# Patient Record
Sex: Female | Born: 1960
Health system: Southern US, Community
[De-identification: ages and names within clinical notes are randomized; demographics above are authoritative.]

---

## 2000-01-28 ENCOUNTER — Encounter: Payer: Self-pay | Admitting: Emergency Medicine

## 2000-01-28 ENCOUNTER — Emergency Department (HOSPITAL_COMMUNITY): Admission: EM | Admit: 2000-01-28 | Discharge: 2000-01-28 | Payer: Self-pay | Admitting: Emergency Medicine

## 2000-02-16 ENCOUNTER — Encounter: Payer: Self-pay | Admitting: Emergency Medicine

## 2000-02-16 ENCOUNTER — Emergency Department (HOSPITAL_COMMUNITY): Admission: EM | Admit: 2000-02-16 | Discharge: 2000-02-16 | Payer: Self-pay | Admitting: Emergency Medicine

## 2002-10-13 ENCOUNTER — Encounter: Payer: Self-pay | Admitting: Family Medicine

## 2002-10-13 ENCOUNTER — Ambulatory Visit (HOSPITAL_COMMUNITY): Admission: RE | Admit: 2002-10-13 | Discharge: 2002-10-13 | Payer: Self-pay | Admitting: Family Medicine

## 2003-10-19 ENCOUNTER — Other Ambulatory Visit: Admission: RE | Admit: 2003-10-19 | Discharge: 2003-10-19 | Payer: Self-pay | Admitting: *Deleted

## 2003-12-03 ENCOUNTER — Emergency Department (HOSPITAL_COMMUNITY): Admission: EM | Admit: 2003-12-03 | Discharge: 2003-12-03 | Payer: Self-pay

## 2004-03-08 ENCOUNTER — Inpatient Hospital Stay (HOSPITAL_COMMUNITY): Admission: AD | Admit: 2004-03-08 | Discharge: 2004-03-08 | Payer: Self-pay | Admitting: *Deleted

## 2004-05-02 ENCOUNTER — Other Ambulatory Visit: Admission: RE | Admit: 2004-05-02 | Discharge: 2004-05-02 | Payer: Self-pay | Admitting: Gynecology

## 2004-05-10 ENCOUNTER — Encounter: Admission: RE | Admit: 2004-05-10 | Discharge: 2004-05-10 | Payer: Self-pay | Admitting: Gynecology

## 2005-03-01 ENCOUNTER — Encounter (INDEPENDENT_AMBULATORY_CARE_PROVIDER_SITE_OTHER): Payer: Self-pay | Admitting: Specialist

## 2005-03-01 ENCOUNTER — Inpatient Hospital Stay (HOSPITAL_COMMUNITY): Admission: RE | Admit: 2005-03-01 | Discharge: 2005-03-03 | Payer: Self-pay | Admitting: Gynecology

## 2005-04-26 ENCOUNTER — Other Ambulatory Visit: Admission: RE | Admit: 2005-04-26 | Discharge: 2005-04-26 | Payer: Self-pay | Admitting: Gynecology

## 2006-04-30 ENCOUNTER — Other Ambulatory Visit: Admission: RE | Admit: 2006-04-30 | Discharge: 2006-04-30 | Payer: Self-pay | Admitting: Gynecology

## 2006-10-13 ENCOUNTER — Emergency Department (HOSPITAL_COMMUNITY): Admission: EM | Admit: 2006-10-13 | Discharge: 2006-10-13 | Payer: Self-pay | Admitting: Emergency Medicine

## 2010-05-08 ENCOUNTER — Emergency Department (HOSPITAL_COMMUNITY): Admission: EM | Admit: 2010-05-08 | Discharge: 2010-05-08 | Payer: Self-pay | Admitting: Emergency Medicine

## 2011-03-03 NOTE — H&P (Signed)
Michelle Choi, TRAORE                  ACCOUNT NO.:  [id number]   MEDICAL RECORD NO.:  [id number]          PATIENT TYPE:  OBV   LOCATION:  1617                         FACILITY:  [facility]   PHYSICIAN:  Gretta Cool, M.D. DATE OF BIRTH:  1961-01-29   DATE OF ADMISSION:  03/01/2005  DATE OF DISCHARGE:                                HISTORY & PHYSICAL   CHIEF COMPLAINT:  1.  Incapacitating cyclic pelvic pain.  2.  Abnormal uterine bleeding.   HISTORY OF PRESENT ILLNESS:  A 50 year old G3, P1, with only one living  child.  She has had regular cyclic menses until recently, when her flow has  increased to seven days, associated with difficulty controlling with pads.  She also has had increasingly severe an incapacitating cyclic pelvic pain.  Her pain onsets approximately midcycle, lasts until menses, is relieved  after onset of menses and recurs in subsequent cycles.  She has had  particularly severe right lower quadrant.  She has had evaluation including  cultures and treatment for possible endometritis or salpingitis without  improvement.  Cultures were negative.  Her FSH is elevated slightly, day 3,  to 16.2, indicating that she is approaching menopause.  She has a history of  cesarean section delivery and a history of tubal sterilization at a  subsequent procedure.  She has on ultrasound a small posterior wall myoma  that encroaches the cavity.  She also has a complex scalloped ovarian cyst  suspicious for a small ovarian neoplasm versus hemorrhage into corpus luteum  cyst.  Her Pap is current and normal.  She has no history of abnormal  cytology ever.  She has no pelvic support problems with history of cesarean  section delivery only.   PAST MEDICAL HISTORY:  Usual childhood disease without sequelae.  Medical  illnesses:  None of consequence.  Accidents and injuries:  None.   ALLERGIES:  None.   PRESENT MEDICATIONS:  None.   FAMILY HISTORY:  Mother died of lung cancer, was a heavy  smoker.  Father  died of emphysema.  She has a history of a brother with MI at 51 and sister  with hypertension.  No other known familial tendencies.   SOCIAL HISTORY:  The patient is married.  She is employed at [employer].  She has one 3 year old child living at home.   PHYSICAL EXAMINATION:  GENERAL:  Well-developed, well-nourished black  female, 166 pounds, 5 feet 6-1/2 inches.  GENERAL:  Entirely unremarkable.  HEENT:  Oropharynx clear.  NECK:  Supple without mass or thyroid enlargement.  CHEST:  Clear P to A.  BREASTS:  Without mass, nodes, nipple discharge.  CARDIAC:  Regular rate and rhythm without murmur or cardiac enlargement.  ABDOMEN:  Soft, scaphoid, without mass or organomegaly.  A large depression  from her previous cesarean section scar is present.  PELVIC:  External genitalia normal female.  Vagina clean, rugous.  Cervix is  large, parous.  Her uterus is exquisitely tender to manipulation.  I do not  palpate significant adnexal mass or fullness.  Her uterus is upper limits of  normal size, adnexa clear.  Rectovaginal confirms.  EXTREMITIES:  Negative.  NEUROLOGIC:  Physiologic.   IMPRESSION:  1.  Incapacitating cyclic pelvic pain, increasingly severe.  2.  Abnormal uterine bleeding.  3.  History of cesarean section delivery.  4.  History of tubal sterilization.   PLAN:  Laparotomy, supracervical versus total abdominal hysterectomy,  possible salpingo-oophorectomy.      CWL/MEDQ  D:  03/02/2005  T:  03/02/2005  Job:  [id number]

## 2011-03-03 NOTE — Op Note (Signed)
NAMETELENA, Michelle Choi                  ACCOUNT NO.:  [id number]   MEDICAL RECORD NO.:  [id number]          PATIENT TYPE:  AMB   LOCATION:  DAY                          FACILITY:  [facility]   PHYSICIAN:  Gretta Cool, M.D. DATE OF BIRTH:  04-Nov-1960   DATE OF PROCEDURE:  03/01/2005  DATE OF DISCHARGE:                                 OPERATIVE REPORT   PREOPERATIVE DIAGNOSES:  1.  Incapacitating cyclic pelvic pain.  2.  Abnormal uterine bleeding.   POSTOPERATIVE DIAGNOSES:  1.  Incapacitating cyclic pelvic pain.  2.  Abnormal uterine bleeding.  3.  Previous salpingectomy.   SURGEON:  Gretta Cool, M.D.   ASSISTANT:  Raynald Kemp, M.D.   PROCEDURE:  Supracervical hysterectomy and bilateral oophorectomy. Note she  has had previous salpingectomy elsewhere.   DESCRIPTION OF PROCEDURE:  Under excellent general anesthesia oral tracheal,  a Pfannenstiel incision was made by excision of her previous scar. Note she  had a large indentation the entire length of the incision. At this point,  the incision was extended through the fascia. The fascia was then dissected  from the rectus muscles, the rectus muscles separated in the midline,  peritoneum opened. The abdomen was then explored. There were no  abnormalities in the upper abdomen. Both kidneys felt normal, no adenopathy.  Examination of the uterus and ovaries revealed previous salpingectomy of  virtually the entire fallopian tube. Both ovaries appeared quiescent. Note  she has documentation of FSH elevation. There was significant advancement of  the bladder from previous cesarean section delivery. At this point, the  procedure was begun by transection of the round ligaments, the ligaments  were tied and the anterior leaf of the broad ligament opened. The  infundibulopelvic vessels were then skeletonized. A decision was made to  remove both ovaries because of their adhesion from the previous  salpingectomy and evidence of  perimenopausal state. There was some suspicion  of endometriosis in the depth of the pelvis but none involving the ovaries.  The infundibulopelvic vessels were then clamped, cut, sutured and tied with  #0 Vicryl. A second free tie was used to doubly ligate the pedicle. The  uterine vessels were then skeletonized, clamped, cut, sutured and tied with  #0 Vicryl. The cervix was then incised and a conical incision made so as to  remove most of the endocervical tissue by conical incision to remove the  canal. The remaining endocervical tissue was then cauterized by monopolar  cautery so as to eliminate any viable endometrial glands present. At this  point, the cervix was closed with a mattress closure of #0 Vicryl, bleeding  was well controlled. At this point, the cuff was closed with running suture  of 2-0 Monocryl. The packs and retractors were then removed, pelvis  irrigated to remove all debris. The abdominoperitoneum was then closed with  running suture of #0 Monocryl. The rectus muscles were plicated in the  midline with a running suture of #0 Monocryl also. The fascia was  approximated with a running suture of #0 Vicryl from each angle to the  midline. The subcutaneous tissue was then undermined along the rectus fascia  so as to remove the adhesion of Scarpa's fascia to the primary fascial  incision and to remove the deep depression in the previous scar. Scarpa's  fascia was then excised where it was densely scarred and the subcutaneous  tissue undermined for a distance of at least three times the distance needed  to approximate the Scarpa's once again.  Scarpa's was then approximated with interrupted sutures of 3-0 Vicryl. The  skin was then closed with staples and intermittent sutures of 4-0 black silk  so as to prevent inversion of the incision line. At this point, the  procedure was terminated without complications. The patient returned to the  recovery room in excellent  condition.      CWL/MEDQ  D:  03/01/2005  T:  03/01/2005  Job:  [id number]

## 2011-03-03 NOTE — Discharge Summary (Signed)
[name], Choi                  ACCOUNT NO.:  [id number]   MEDICAL RECORD NO.:  [id number]          PATIENT TYPE:  INP   LOCATION:  1617                         [facility]AC[facility]   PHYSICIAN:  Gretta Cool, M.D. DATE OF BIRTH:  1961-01-18   DATE OF ADMISSION:  03/01/2005  DATE OF DISCHARGE:  03/03/2005                                 DISCHARGE SUMMARY   HISTORY OF PRESENT ILLNESS:  Michelle Choi is a 50 year old female gravida 3,  para 1who has had regular cyclic periods until recently, at which time her  flow has increased to seven days associated with heavy bleeding and  increasing and incapacitating cyclic pelvic pain. The pain begins mid cycle  and lasts until her menses and is completely relieved after her menses and  recurs in this pattern.  The pain is more locally confined to the right  lower quadrant.  She has had extensive evaluation including culture and  treatment for possible endometritis or salpingitis without improvement.  FSH  is slightly elevated on day #3 to 16.2 indicating that she is approaching  menopause.  She has a history of cesarean section delivery and tubal  sterilization.  Ultrasound revealed a small posterior wall myoma that  encroaches upon the cavity.  There is also a complex scalloped ovarian cyst  suspicious for small ovarian neoplasm versus hemorrhagic corpus luteal cyst.  Pap smear is normal and current.  She has no pelvic support problems.  She  is now admitted for definitive therapy by laparotomy, supracervical versus  total abdominal hysterectomy, possible salpingo-oophorectomy.   ALLERGIES:  None.   ADMISSION EXAMINATION:  CHEST:  Clear to A&P.  HEART:  Rate and rhythm regular without murmur or gallop or cardiac  enlargement.  ABDOMEN:  Soft and scaphoid without masses or organomegaly.  There is a huge  depression from her previous C-section scar.  PELVIC:  External genitalia within normal limits.  Female vagina clean and  rugose.  Cervix  is large and parous.  The uterus is exquisitely tender to  manipulation.  No adnexal masses or fullness are appreciated.  The uterus is  upwards limit of size.  Adnexa bilaterally clear.  Rectovaginal exam  confirms.   IMPRESSION:  1.  Incapacitated cyclic pain increasingly severe.  2.  Abnormal uterine bleeding.  3.  History of cesarean section delivery.  4.  History of tubal sterilization.   PLAN:  Laparotomy, supracervical versus total abdominal hysterectomy and a  possible salpingo-oophorectomy under general anesthesia.  Risks and benefits  were discussed with the patient and she accepts these procedures.   LABORATORY DATA:  Admission hemoglobin was 12.1, hematocrit 34.9.   HOSPITAL COURSE:  The patient underwent supracervical hysterectomy and  bilateral oophorectomy under general anesthesia.  It is noted that she had  previous salpingectomy at the time of her tubal.  The procedures were  completed and the patient was to return to the recovery room in excellent  condition.  There were no complications.  Pathology report reveals benign  endocervical tissue with focal squamous metaplasia, benign synchrotory phase  endometrium and leiomata  and tube resections were unremarkable.  Her  postoperative course was without complications and she was discharged on the  second postoperative day in excellent condition.   FINAL DISCHARGE INSTRUCTIONS:  No heavy lifting or straining.  No vaginal  entrance.  Increase ambulation as tolerated.  She is to call for any fever  over 100.5 or failure of daily improvement.   DIET:  Regular.   DISCHARGE MEDICATIONS:  1.  Celebrex one daily.  2.  Tylox one p.o.   FOLLOW UP:  Return to the office in one week for followup.   CONDITION ON DISCHARGE:  Excellent.   FINAL DISCHARGE DIAGNOSES:  1.  Incapacitating cyclic pelvic pain.  2.  Abnormal uterine bleeding.  3.  Uterine leiomyomata.   PROCEDURES PERFORMED:  1.  Supracervical hysterectomy and  bilateral oophorectomy under general      anesthesia.  2.  General anesthesia.        EMK/MEDQ  D:  04/19/2005  T:  04/19/2005  Job:  [id number]

## 2013-08-27 ENCOUNTER — Other Ambulatory Visit: Payer: Self-pay

## 2013-08-27 DIAGNOSIS — Z1231 Encounter for screening mammogram for malignant neoplasm of breast: Secondary | ICD-10-CM

## 2013-09-12 ENCOUNTER — Ambulatory Visit
Admission: RE | Admit: 2013-09-12 | Discharge: 2013-09-12 | Disposition: A | Discharge status: Home / Self Care | Payer: Self-pay | Source: Ambulatory Visit

## 2013-09-12 ENCOUNTER — Ambulatory Visit: Payer: Self-pay

## 2013-09-12 DIAGNOSIS — Z1231 Encounter for screening mammogram for malignant neoplasm of breast: Secondary | ICD-10-CM

## 2016-05-26 DIAGNOSIS — K047 Periapical abscess without sinus: Secondary | ICD-10-CM | POA: Diagnosis not present

## 2016-07-29 DIAGNOSIS — Z23 Encounter for immunization: Secondary | ICD-10-CM | POA: Diagnosis not present

## 2016-11-06 DIAGNOSIS — M549 Dorsalgia, unspecified: Secondary | ICD-10-CM | POA: Diagnosis not present

## 2016-11-06 DIAGNOSIS — E782 Mixed hyperlipidemia: Secondary | ICD-10-CM | POA: Diagnosis not present

## 2016-11-06 DIAGNOSIS — Z0000 Encounter for general adult medical examination without abnormal findings: Secondary | ICD-10-CM | POA: Diagnosis not present

## 2017-05-04 DIAGNOSIS — K5900 Constipation, unspecified: Secondary | ICD-10-CM | POA: Diagnosis not present

## 2017-05-04 DIAGNOSIS — L219 Seborrheic dermatitis, unspecified: Secondary | ICD-10-CM | POA: Diagnosis not present

## 2017-05-04 DIAGNOSIS — R232 Flushing: Secondary | ICD-10-CM | POA: Diagnosis not present

## 2017-08-10 DIAGNOSIS — Z23 Encounter for immunization: Secondary | ICD-10-CM | POA: Diagnosis not present

## 2018-01-01 DIAGNOSIS — Z0000 Encounter for general adult medical examination without abnormal findings: Secondary | ICD-10-CM | POA: Diagnosis not present

## 2018-01-01 DIAGNOSIS — H8110 Benign paroxysmal vertigo, unspecified ear: Secondary | ICD-10-CM | POA: Diagnosis not present

## 2018-01-01 DIAGNOSIS — K5900 Constipation, unspecified: Secondary | ICD-10-CM | POA: Diagnosis not present

## 2018-01-01 DIAGNOSIS — Z1231 Encounter for screening mammogram for malignant neoplasm of breast: Secondary | ICD-10-CM | POA: Diagnosis not present

## 2018-01-01 DIAGNOSIS — E782 Mixed hyperlipidemia: Secondary | ICD-10-CM | POA: Diagnosis not present

## 2018-05-30 DIAGNOSIS — H8110 Benign paroxysmal vertigo, unspecified ear: Secondary | ICD-10-CM | POA: Diagnosis not present

## 2018-08-01 ENCOUNTER — Ambulatory Visit (INDEPENDENT_AMBULATORY_CARE_PROVIDER_SITE_OTHER): Payer: BLUE CROSS/BLUE SHIELD

## 2018-08-01 ENCOUNTER — Ambulatory Visit (INDEPENDENT_AMBULATORY_CARE_PROVIDER_SITE_OTHER): Payer: BLUE CROSS/BLUE SHIELD | Admitting: Podiatry

## 2018-08-01 VITALS — BP 144/68 | HR 74

## 2018-08-01 DIAGNOSIS — M2041 Other hammer toe(s) (acquired), right foot: Secondary | ICD-10-CM

## 2018-08-01 DIAGNOSIS — M2011 Hallux valgus (acquired), right foot: Secondary | ICD-10-CM

## 2018-08-01 DIAGNOSIS — M2012 Hallux valgus (acquired), left foot: Secondary | ICD-10-CM | POA: Diagnosis not present

## 2018-08-01 NOTE — Patient Instructions (Addendum)
Bunion A bunion is a bump on the base of the big toe that forms when the bones of the big toe joint move out of position. Bunions may be small at first, but they often get larger over time. The can make walking painful. What are the causes? A bunion may be caused by:  Wearing narrow or pointed shoes that force the big toe to press against the other toes.  Abnormal foot development that causes the foot to roll inward (pronate).  Changes in the foot that are caused by certain diseases, such as rheumatoid arthritis and polio.  A foot injury.  What increases the risk? The following factors may make you more likely to develop this condition:  Wearing shoes that squeeze the toes together.  Having certain diseases, such as: ? Rheumatoid arthritis. ? Polio. ? Cerebral palsy.  Having family members who have bunions.  Being born with a foot deformity, such as flat feet or low arches.  Doing activities that put a lot of pressure on the feet, such as ballet dancing.  What are the signs or symptoms? The main symptom of a bunion is a noticeable bump on the big toe. Other symptoms may include:  Pain.  Swelling around the big toe.  Redness and inflammation.  Thick or hardened skin on the big toe or between the toes.  Stiffness or loss of motion in the big toe.  Trouble with walking.  How is this diagnosed? A bunion may be diagnosed based on your symptoms, medical history, and activities. You may have tests, such as:  X-rays. These allow your health care provider to check the position of the bones in your foot and look for damage to your joint. They also help your health care provider to determine the severity of your bunion and the best way to treat it.  Joint aspiration. In this test, a sample of fluid is removed from the toe joint. This test, which may be done if you are in a lot of pain, helps to rule out diseases that cause painful swelling of the joints, such as  arthritis.  How is this treated? There is no cure for a bunion, but treatment can help to prevent a bunion from getting worse. Treatment depends on the severity of your symptoms. Your health care provider may recommend:  Wearing shoes that have a wide toe box.  Using bunion pads to cushion the affected area.  Taping your toes together to keep them in a normal position.  Placing a device inside your shoe (orthotics) to help reduce pressure on your toe joint.  Taking medicine to ease pain, inflammation, and swelling.  Applying heat or ice to the affected area.  Doing stretching exercises.  Surgery to remove scar tissue and move the toes back into their normal position. This treatment is rare.  Follow these instructions at home:  Support your toe joint with proper footwear, shoe padding, or taping as told by your health care provider.  Take over-the-counter and prescription medicines only as told by your health care provider.  If directed, apply ice to the injured area: ? Put ice in a plastic bag. ? Place a towel between your skin and the bag. ? Leave the ice on for 20 minutes, 2-3 times per day.  If directed, apply heat to the affected area before you exercise. Use the heat source that your health care provider recommends, such as a moist heat pack or a heating pad. ? Place a towel between your   skin and the heat source. ? Leave the heat on for 20-30 minutes. ? Remove the heat if your skin turns bright red. This is especially important if you are unable to feel pain, heat, or cold. You may have a greater risk of getting burned.  Do exercises as told by your health care provider.  Keep all follow-up visits as told by your health care provider. Contact a health care provider if:  Your symptoms get worse.  Your symptoms do not improve in 2 weeks. Get help right away if:  You have severe pain and trouble with walking. This information is not intended to replace advice given  to you by your health care provider. Make sure you discuss any questions you have with your health care provider. Document Released: 10/02/2005 Document Revised: 03/09/2016 Document Reviewed: 05/02/2015 Elsevier Interactive Patient Education  2018 [employer]  Pre-Operative Instructions  Congratulations, you have decided to take an important step towards improving your quality of life.  You can be assured that the doctors and staff at [facility] will be with you every step of the way.  Here are some important things you should know:  1. Plan to be at the surgery center/hospital at least 1 (one) hour prior to your scheduled time, unless otherwise directed by the surgical center/hospital staff.  You must have a responsible adult accompany you, remain during the surgery and drive you home.  Make sure you have directions to the surgical center/hospital to ensure you arrive on time. 2. If you are having surgery at [facility] or [facility], you will need a copy of your medical history and physical form from your family physician within one month prior to the date of surgery. We will give you a form for your primary physician to complete.  3. We make every effort to accommodate the date you request for surgery.  However, there are times where surgery dates or times have to be moved.  We will contact you as soon as possible if a change in schedule is required.   4. No aspirin/ibuprofen for one week before surgery.  If you are on aspirin, any non-steroidal anti-inflammatory medications (Mobic, Aleve, Ibuprofen) should not be taken seven (7) days prior to your surgery.  You make take Tylenol for pain prior to surgery.  5. Medications - If you are taking daily heart and blood pressure medications, seizure, reflux, allergy, asthma, anxiety, pain or diabetes medications, make sure you notify the surgery center/hospital before the day of surgery so they can tell you which medications you should  take or avoid the day of surgery. 6. No food or drink after midnight the night before surgery unless directed otherwise by surgical center/hospital staff. 7. No alcoholic beverages 24-hours prior to surgery.  No smoking 24-hours prior or 24-hours after surgery. 8. Wear loose pants or shorts. They should be loose enough to fit over bandages, boots, and casts. 9. Don't wear slip-on shoes. Sneakers are preferred. 10. Bring your boot with you to the surgery center/hospital.  Also bring crutches or a walker if your physician has prescribed it for you.  If you do not have this equipment, it will be provided for you after surgery. 11. If you have not been contacted by the surgery center/hospital by the day before your surgery, call to confirm the date and time of your surgery. 12. Leave-time from work may vary depending on the type of surgery you have.  Appropriate arrangements should be made prior   to surgery with your employer. 13. Prescriptions will be provided immediately following surgery by your doctor.  Fill these as soon as possible after surgery and take the medication as directed. Pain medications will not be refilled on weekends and must be approved by the doctor. 14. Remove nail polish on the operative foot and avoid getting pedicures prior to surgery. 15. Wash the night before surgery.  The night before surgery wash the foot and leg well with water and the antibacterial soap provided. Be sure to pay special attention to beneath the toenails and in between the toes.  Wash for at least three (3) minutes. Rinse thoroughly with water and dry well with a towel.  Perform this wash unless told not to do so by your physician.  Enclosed: 1 Ice pack (please put in freezer the night before surgery)   1 Hibiclens skin cleaner   Pre-op instructions  If you have any questions regarding the instructions, please do not hesitate to call our office.  [place]: [address], [place], [place] [address] --  [phone]  [place]: [address] [place], [place] [address] -- [phone]  [place]: [address]  [place], [place] [address] -- [phone]  [place]: [address], [address], [place], [place] [address] -- [phone]  Website: [email or web address]  

## 2018-08-02 NOTE — Progress Notes (Signed)
Subjective:   Patient ID: Michelle Choi, female   DOB: 57 y.o.   MRN: [id number]   HPI Patient presents with significant structural bunion that is become increasingly painful right foot and digital deformity fifth digit bilateral and states it is making it harder for her to wear shoe gear comfortably and that she is tried wider shoes and soaks of this without relief.  Patient does not smoke and likes to be active   Review of Systems  All other systems reviewed and are negative.       Objective:  Physical Exam  Constitutional: She appears well-developed and well-nourished.  Cardiovascular: Intact distal pulses.  Pulmonary/Chest: Effort normal.  Musculoskeletal: Normal range of motion.  Neurological: She is alert.  Skin: Skin is warm.  Nursing note and vitals reviewed.   Neurovascular status found to be intact muscle strength is adequate range of motion within normal limits with patient found to have structural redness and pain around the first metatarsal head right and is noted to have keratotic lesion fifth digit bilateral that are painful when pressed along with good digital perfusion and patient was noted to be well oriented x3     Assessment:  Structural HAV deformity right over left with pain hammertoe deformity fifth digit bilateral     Plan:  H&P conditions reviewed and patient states that she is thought about this a lot and wants corrected and needs to get it done as soon as possible due to her schedule.  Since she wanted to have this done and understands this I did allow her to do consent form today and I reviewed at great length distal osteotomy hammertoe repair fifth right and reviewed alternative treatments complications.  Patient is willing to accept risk and after extensive review signed consent form and is scheduled for outpatient surgery.  She understands total recovery.  Will be 6 months to 1 year and there is no long-term guarantees and today I did go ahead and I  dispensed air fracture walker with all instructions on usage.  She is encouraged to call with any questions prior to procedure  X-rays indicate that there is elevation of the intermetatarsal angle bilateral and rotation fifth digit bilateral

## 2018-08-05 DIAGNOSIS — Z23 Encounter for immunization: Secondary | ICD-10-CM | POA: Diagnosis not present

## 2018-08-09 DIAGNOSIS — M79676 Pain in unspecified toe(s): Secondary | ICD-10-CM

## 2018-08-12 ENCOUNTER — Telehealth: Payer: Self-pay | Admitting: Podiatry

## 2018-08-12 NOTE — Telephone Encounter (Signed)
Pt is getting surgery tomorrow with Dr. Charlsie Merles,. She is requesting that her medication get sent over to her pharmacy today before the surgery. Please give patient a call.

## 2018-08-12 NOTE — Telephone Encounter (Signed)
I informed pt Dr. Charlsie Merles did not call the surgery medications in prior to the surgery, she would still have anesthesia for the medication to be picked up in a timely manner.

## 2018-08-13 DIAGNOSIS — M2041 Other hammer toe(s) (acquired), right foot: Secondary | ICD-10-CM | POA: Diagnosis not present

## 2018-08-13 DIAGNOSIS — Z01818 Encounter for other preprocedural examination: Secondary | ICD-10-CM | POA: Diagnosis not present

## 2018-08-13 DIAGNOSIS — M2042 Other hammer toe(s) (acquired), left foot: Secondary | ICD-10-CM | POA: Diagnosis not present

## 2018-08-13 DIAGNOSIS — M2011 Hallux valgus (acquired), right foot: Secondary | ICD-10-CM | POA: Diagnosis not present

## 2018-08-13 DIAGNOSIS — M21612 Bunion of left foot: Secondary | ICD-10-CM | POA: Diagnosis not present

## 2018-08-16 ENCOUNTER — Encounter: Payer: Self-pay | Admitting: Podiatry

## 2018-08-21 ENCOUNTER — Ambulatory Visit (INDEPENDENT_AMBULATORY_CARE_PROVIDER_SITE_OTHER): Payer: BLUE CROSS/BLUE SHIELD

## 2018-08-21 ENCOUNTER — Ambulatory Visit (INDEPENDENT_AMBULATORY_CARE_PROVIDER_SITE_OTHER): Payer: BLUE CROSS/BLUE SHIELD | Admitting: Podiatry

## 2018-08-21 VITALS — Temp 97.7°F

## 2018-08-21 DIAGNOSIS — M2041 Other hammer toe(s) (acquired), right foot: Secondary | ICD-10-CM

## 2018-08-21 DIAGNOSIS — M2011 Hallux valgus (acquired), right foot: Secondary | ICD-10-CM

## 2018-08-21 DIAGNOSIS — M2012 Hallux valgus (acquired), left foot: Secondary | ICD-10-CM

## 2018-08-25 NOTE — Progress Notes (Signed)
Subjective:   Patient ID: Michelle Choi, female   DOB: 57 y.o.   MRN: [id number]   HPI Patient presents stating that she is having some swelling in her right foot and admits that she walked without her boot against the instructions that we had given her.  One week after having foot surgery right   ROS      Objective:  Physical Exam  Neurovascular status intact negative Homans sign noted with patient's right foot healing well with moderate edema around the first metatarsal where she probably did some excessive activity in this area.  Fifth digit was noted to be healing well with wound edges well coapted and no drainage noted     Assessment:  Patient has been noncompliant but overall appears to be healing well with good alignment of the hallux and good position fifth digit     Plan:  H&P x-ray reviewed sterile dressing reapplied advised on continued elevation compression immobilization and discussed the importance of not walking on her foot without protection.  Patient will be checked in 2 weeks or earlier if needed  X-ray indicates good alignment of the first metatarsal with some stress on the first metatarsal distal segment secondary to noncompliance and walking on her foot without immobilization.  We will keep an eye on this in the near future

## 2018-09-04 ENCOUNTER — Ambulatory Visit (INDEPENDENT_AMBULATORY_CARE_PROVIDER_SITE_OTHER): Payer: BLUE CROSS/BLUE SHIELD | Admitting: Podiatry

## 2018-09-04 ENCOUNTER — Ambulatory Visit (INDEPENDENT_AMBULATORY_CARE_PROVIDER_SITE_OTHER): Payer: BLUE CROSS/BLUE SHIELD

## 2018-09-04 DIAGNOSIS — M2041 Other hammer toe(s) (acquired), right foot: Secondary | ICD-10-CM

## 2018-09-04 DIAGNOSIS — M2011 Hallux valgus (acquired), right foot: Secondary | ICD-10-CM

## 2018-09-04 DIAGNOSIS — M2012 Hallux valgus (acquired), left foot: Secondary | ICD-10-CM

## 2018-09-05 NOTE — Progress Notes (Signed)
Subjective:   Patient ID: Michelle BorgJudy L Elms, female   DOB: 57 y.o.   MRN: [id number]   HPI Patient states overall doing very well with the right foot and very pleased   ROS      Objective:  Physical Exam  Neurovascular status intact with patient's right foot healing well post osteotomy digital procedure with good alignment wound edges coapted well range of motion good     Assessment:  Doing well post osteotomy right arthroplasty fifth right     Plan:  Reviewed condition and recommended the continuation of conservative care continuation of range of motion exercises compression elevation and dispensed ankle compression stocking  X-rays indicate osteotomies healing well joint congruous fixation in place

## 2018-09-18 ENCOUNTER — Telehealth: Payer: Self-pay | Admitting: Podiatry

## 2018-09-18 NOTE — Telephone Encounter (Signed)
Patient needs a return to work note, she will be returning  On Monday 09/23/18. She will come in to pick note up.

## 2018-09-19 ENCOUNTER — Encounter: Payer: Self-pay | Admitting: *Deleted

## 2018-09-19 NOTE — Telephone Encounter (Signed)
I informed pt Dr. Charlsie Merlesegal stated she could return to work 09/23/2018, and asked if she needed to go back on as light duty. Pt states she could go back on full duty and would like to pick up the note in the [place] office. I told pt I would have the letter at the front reception for pick up.

## 2018-09-19 NOTE — Telephone Encounter (Signed)
None if she feels ok. Light duty if she needs it

## 2018-10-02 ENCOUNTER — Ambulatory Visit (INDEPENDENT_AMBULATORY_CARE_PROVIDER_SITE_OTHER): Payer: BLUE CROSS/BLUE SHIELD | Admitting: Podiatry

## 2018-10-02 ENCOUNTER — Encounter: Payer: Self-pay | Admitting: Podiatry

## 2018-10-02 ENCOUNTER — Ambulatory Visit (INDEPENDENT_AMBULATORY_CARE_PROVIDER_SITE_OTHER): Payer: BLUE CROSS/BLUE SHIELD

## 2018-10-02 DIAGNOSIS — M542 Cervicalgia: Secondary | ICD-10-CM | POA: Insufficient documentation

## 2018-10-02 DIAGNOSIS — M2041 Other hammer toe(s) (acquired), right foot: Secondary | ICD-10-CM

## 2018-10-02 DIAGNOSIS — M2011 Hallux valgus (acquired), right foot: Secondary | ICD-10-CM

## 2018-10-02 DIAGNOSIS — M2012 Hallux valgus (acquired), left foot: Secondary | ICD-10-CM

## 2018-10-02 MED ORDER — DICLOFENAC SODIUM 75 MG PO TBEC: 75.0000 mg | DELAYED_RELEASE_TABLET | Freq: Two times a day (BID) | ORAL | 2 refills | Status: AC

## 2018-10-02 NOTE — Progress Notes (Signed)
Subjective:   Patient ID: Michelle Choi, female   DOB: 57 y.o.   MRN: [id number]   HPI Patient presents stating that overall doing very well with minimal discomfort and on feet all day and at times he gets tired   ROS      Objective:  Physical Exam  Neurovascular status intact with patient's right foot overall healing well wound edges well coapted hallux in rectus position joint motion good with no crepitus with mild discomfort in the forefoot     Assessment:  Overall doing well with mild discomfort normal for this.  After postoperative surgery and the fact she works 8 to 12 hours a day     Plan:  Reviewed condition recommended continued range of motion exercises compression as needed and placed on diclofenac to try to reduce remaining inflammation  X-rays indicate the osteotomy is healing well fixation in place good alignment joint congruence

## 2018-10-11 DIAGNOSIS — L821 Other seborrheic keratosis: Secondary | ICD-10-CM | POA: Diagnosis not present

## 2018-11-13 ENCOUNTER — Encounter: Payer: Self-pay | Admitting: Podiatry

## 2018-11-13 ENCOUNTER — Ambulatory Visit (INDEPENDENT_AMBULATORY_CARE_PROVIDER_SITE_OTHER): Payer: BLUE CROSS/BLUE SHIELD | Admitting: Podiatry

## 2018-11-13 ENCOUNTER — Ambulatory Visit (INDEPENDENT_AMBULATORY_CARE_PROVIDER_SITE_OTHER): Payer: BLUE CROSS/BLUE SHIELD

## 2018-11-13 DIAGNOSIS — M2041 Other hammer toe(s) (acquired), right foot: Secondary | ICD-10-CM | POA: Diagnosis not present

## 2018-11-13 NOTE — Progress Notes (Signed)
Subjective:   Patient ID: Michelle Choi, female   DOB: 58 y.o.   MRN: [id number]   HPI Patient states overall doing well but just was concerned about the motion and some numbness in the fifth toe   ROS      Objective:  Physical Exam  Neurovascular status intact with patient's right foot clinically looking beautiful with good alignment incision sites healing well minimal edema with good range of motion     Assessment:  Still in the healing phase with mild swelling which I think is causing concern for patient     Plan:  X-ray reviewed advised on gradual increase in activities and this should heal uneventfully and will be seen back as needed  X-ray indicates that the osteotomy is healing well fixation in place joint congruence

## 2019-03-17 DIAGNOSIS — Z1231 Encounter for screening mammogram for malignant neoplasm of breast: Secondary | ICD-10-CM | POA: Diagnosis not present

## 2019-06-05 DIAGNOSIS — B079 Viral wart, unspecified: Secondary | ICD-10-CM | POA: Diagnosis not present

## 2019-06-05 DIAGNOSIS — D485 Neoplasm of uncertain behavior of skin: Secondary | ICD-10-CM | POA: Diagnosis not present

## 2019-07-18 DIAGNOSIS — H40013 Open angle with borderline findings, low risk, bilateral: Secondary | ICD-10-CM | POA: Diagnosis not present

## 2019-07-22 DIAGNOSIS — Z0000 Encounter for general adult medical examination without abnormal findings: Secondary | ICD-10-CM | POA: Diagnosis not present

## 2019-08-07 DIAGNOSIS — Z23 Encounter for immunization: Secondary | ICD-10-CM | POA: Diagnosis not present

## 2019-08-07 DIAGNOSIS — E782 Mixed hyperlipidemia: Secondary | ICD-10-CM | POA: Diagnosis not present

## 2019-08-13 DIAGNOSIS — H8110 Benign paroxysmal vertigo, unspecified ear: Secondary | ICD-10-CM | POA: Diagnosis not present

## 2020-01-30 DIAGNOSIS — M1611 Unilateral primary osteoarthritis, right hip: Secondary | ICD-10-CM | POA: Diagnosis not present

## 2020-01-30 DIAGNOSIS — M25551 Pain in right hip: Secondary | ICD-10-CM | POA: Diagnosis not present

## 2020-01-30 DIAGNOSIS — M7611 Psoas tendinitis, right hip: Secondary | ICD-10-CM | POA: Diagnosis not present

## 2020-03-17 DIAGNOSIS — Z1231 Encounter for screening mammogram for malignant neoplasm of breast: Secondary | ICD-10-CM | POA: Diagnosis not present

## 2020-07-21 ENCOUNTER — Ambulatory Visit (INDEPENDENT_AMBULATORY_CARE_PROVIDER_SITE_OTHER): Payer: BC Managed Care – PPO | Admitting: Surgery

## 2020-07-21 ENCOUNTER — Ambulatory Visit: Payer: Self-pay

## 2020-07-21 ENCOUNTER — Encounter: Payer: Self-pay | Admitting: Surgery

## 2020-07-21 DIAGNOSIS — M25511 Pain in right shoulder: Secondary | ICD-10-CM | POA: Diagnosis not present

## 2020-07-21 DIAGNOSIS — M7541 Impingement syndrome of right shoulder: Secondary | ICD-10-CM

## 2020-07-21 MED ORDER — METHYLPREDNISOLONE ACETATE 40 MG/ML IJ SUSP
40.0000 mg | INTRAMUSCULAR | Status: AC | PRN
  Administered 2020-07-21: 40 mg via INTRA_ARTICULAR

## 2020-07-21 MED ORDER — BUPIVACAINE HCL 0.25 % IJ SOLN
4.0000 mL | INTRAMUSCULAR | Status: AC | PRN
  Administered 2020-07-21: 4 mL via INTRA_ARTICULAR

## 2020-07-21 MED ORDER — LIDOCAINE HCL 1 % IJ SOLN
3.0000 mL | INTRAMUSCULAR | Status: AC | PRN
  Administered 2020-07-21: 3 mL

## 2020-07-21 NOTE — Progress Notes (Signed)
Office Visit Note   Patient: Michelle Choi           Date of Birth: [birth year]           MRN: [id number] Visit Date: 07/21/2020              Requested by: Tally Joe, MD [phone] [place],  [place] [address] PCP: Tally Joe, MD   Assessment & Plan: Visit Diagnoses:  1. Acute pain of right shoulder   2. Impingement syndrome of right shoulder     Plan: In hopes of giving patient relief of her right shoulder pain offered injection.  After patient consent right shoulder was prepped with Betadine and subacromial Marcaine/Depo-Medrol injection was performed.  After sitting for a few minutes patient reported excellent relief of her pain with anesthetic in place.  Advised patient no aggressive activity with her shoulder over the next 2 to 3 days and then can resume regular activities.  Follow-up with me in 3 weeks for recheck.  If her pain returns we will plan to get an MRI to rule out rotator cuff tear or other shoulder pathology.  Follow-Up Instructions: Return in about 3 weeks (around 08/11/2020) for with Trelyn Vanderlinde recheck right shoulder.   Orders:  Orders Placed This Encounter  Procedures  . XR Shoulder Right   No orders of the defined types were placed in this encounter.     Procedures: Large Joint Inj: R subacromial bursa on 07/21/2020 9:41 AM Indications: pain Details: 25 G 1.5 in needle, posterior approach Medications: 3 mL lidocaine 1 %; 4 mL bupivacaine 0.25 %; 40 mg methylPREDNISolone acetate 40 MG/ML Outcome: tolerated well, no immediate complications Consent was given by the patient. Patient was prepped and draped in the usual sterile fashion.       Clinical Data: No additional findings.   Subjective: Chief Complaint  Patient presents with  . Right Shoulder - Pain    HPI 59 year old black female who is new patient to clinic comes in today with complaints of right shoulder pain pain ongoing x1 month.  No injury that she can recall.   Currently pain aggravated overactivity and reaching on her back.  Also bothers her when she lays on her right shoulder at night.  No cervical spinal radicular component.  She thinks that she may have had some right shoulder soreness few months ago but not really sure. Review of Systems No current cardiac pulmonary GI GU issues  Objective: Vital Signs: There were no vitals taken for this visit.  Physical Exam HENT:     Head: Normocephalic and atraumatic.  Eyes:     Extraocular Movements: Extraocular movements intact.     Pupils: Pupils are equal, round, and reactive to light.  Pulmonary:     Effort: No respiratory distress.  Musculoskeletal:     Comments: Service, good range of motion.  Right shoulder she has good range of motion but with discomfort.  Moderate to marked positive impingement test.  Negative drop arm test.  Pain with supraspinatus resistance.  Neurovascular intact.  Neurological:     General: No focal deficit present.     Mental Status: She is alert and oriented to person, place, and time.     Ortho Exam  Specialty Comments:  No specialty comments available.  Imaging: No results found.   PMFS History: Patient Active Problem List   Diagnosis Date Noted  . Neck pain 10/02/2018   History reviewed. No pertinent past medical history.  History reviewed. No pertinent family history.  History reviewed. No pertinent surgical history. Social History   Occupational History  . Not on file  Tobacco Use  . Smoking status: Former Smoker    Types: Cigarettes  . Smokeless tobacco: Never Used  Substance and Sexual Activity  . Alcohol use: Not on file  . Drug use: Not on file  . Sexual activity: Not on file

## 2020-07-29 DIAGNOSIS — Z0000 Encounter for general adult medical examination without abnormal findings: Secondary | ICD-10-CM | POA: Diagnosis not present

## 2020-08-03 DIAGNOSIS — E782 Mixed hyperlipidemia: Secondary | ICD-10-CM | POA: Diagnosis not present

## 2020-08-03 DIAGNOSIS — Z23 Encounter for immunization: Secondary | ICD-10-CM | POA: Diagnosis not present

## 2020-08-11 ENCOUNTER — Ambulatory Visit (INDEPENDENT_AMBULATORY_CARE_PROVIDER_SITE_OTHER): Payer: BC Managed Care – PPO | Admitting: Surgery

## 2020-08-11 ENCOUNTER — Encounter: Payer: Self-pay | Admitting: Surgery

## 2020-08-11 DIAGNOSIS — M7541 Impingement syndrome of right shoulder: Secondary | ICD-10-CM

## 2020-08-11 NOTE — Progress Notes (Signed)
59 year old female returns for recheck of her right shoulder.  Last office visit I performed a subacromial Marcaine/Depo-Medrol injection.  States that shoulder is doing much better.  She has some soreness in the right trapezius but no complaints of neck pain or cervical radiculopathy.  Overall she is pleased.  Exam Very pleasant female alert and oriented in no acute distress.  Right shoulder full range of motion.  Negative drop arm test.  Negative impingement test.  No pain with supraspinatus resistance.  Cervical spine unremarkable.  Plan Patient will follow up with me in 6 weeks for recheck.  If shoulder pain returns before that visit she will contact me I will get MRI of the right shoulder.  All questions answered.

## 2020-08-19 DIAGNOSIS — R519 Headache, unspecified: Secondary | ICD-10-CM | POA: Diagnosis not present

## 2020-08-20 ENCOUNTER — Ambulatory Visit
Admission: RE | Admit: 2020-08-20 | Discharge: 2020-08-20 | Disposition: A | Discharge status: Home / Self Care | Payer: BC Managed Care – PPO | Source: Ambulatory Visit | Attending: Family Medicine | Admitting: Family Medicine

## 2020-08-20 ENCOUNTER — Other Ambulatory Visit: Payer: Self-pay | Admitting: Family Medicine

## 2020-08-20 DIAGNOSIS — R9082 White matter disease, unspecified: Secondary | ICD-10-CM | POA: Diagnosis not present

## 2020-08-20 DIAGNOSIS — R519 Headache, unspecified: Secondary | ICD-10-CM | POA: Diagnosis not present

## 2020-08-20 DIAGNOSIS — G9389 Other specified disorders of brain: Secondary | ICD-10-CM | POA: Diagnosis not present

## 2020-09-22 ENCOUNTER — Ambulatory Visit: Payer: BC Managed Care – PPO | Admitting: Surgery

## 2020-10-14 ENCOUNTER — Ambulatory Visit: Payer: BC Managed Care – PPO

## 2020-10-20 ENCOUNTER — Ambulatory Visit: Payer: BC Managed Care – PPO | Admitting: Surgery

## 2020-10-21 ENCOUNTER — Encounter: Payer: Self-pay | Admitting: Surgery

## 2020-10-21 ENCOUNTER — Ambulatory Visit: Payer: Self-pay

## 2020-10-21 ENCOUNTER — Ambulatory Visit: Payer: BC Managed Care – PPO

## 2020-10-21 ENCOUNTER — Ambulatory Visit (INDEPENDENT_AMBULATORY_CARE_PROVIDER_SITE_OTHER): Payer: No Typology Code available for payment source | Admitting: Surgery

## 2020-10-21 ENCOUNTER — Other Ambulatory Visit: Payer: Self-pay

## 2020-10-21 VITALS — BP 120/83 | HR 73

## 2020-10-21 DIAGNOSIS — M25551 Pain in right hip: Secondary | ICD-10-CM | POA: Diagnosis not present

## 2020-10-21 DIAGNOSIS — M7061 Trochanteric bursitis, right hip: Secondary | ICD-10-CM | POA: Diagnosis not present

## 2020-10-21 DIAGNOSIS — M5416 Radiculopathy, lumbar region: Secondary | ICD-10-CM | POA: Diagnosis not present

## 2020-10-21 MED ORDER — METHYLPREDNISOLONE 4 MG PO TABS: ORAL_TABLET | ORAL | 0 refills | Status: AC

## 2020-10-21 NOTE — Progress Notes (Signed)
Office Visit Note   Patient: Michelle Choi           Date of Birth: 04-22-1961           MRN: [id number] Visit Date: 10/21/2020              Requested by: Tally Joe, MD [phone] [address] [place],  [place] [address] PCP: Tally Joe, MD   Assessment & Plan: Visit Diagnoses:  1. Pain in right hip   2. Trochanteric bursitis, right hip     Plan: X-rays reviewed with patient today.  Since she is complaining of about an equal amount of pain over the right lateral hip and right buttock I sent in a prescription for Medrol Dosepak 6-day taper to be taken as directed.  I gave patient some stretching exercises her quads, hamstring, piriformis and IT band.  Follow-up with me in 3 weeks for recheck.  We will make decision at that point if patient needs greater trochanter bursa Marcaine/Depo-Medrol injection versus further imaging studies with lumbar MRI.  Right shoulder is doing better from injection and I encouraged patient to use it more to see how she does.  We will consider MRI scan there if this is still a issue.  Follow-Up Instructions: Return in about 3 weeks (around 11/11/2020) for with Jeffrey Graefe recheck back and right hip.   Orders:  Orders Placed This Encounter  Procedures  . XR HIP UNILAT W OR W/O PELVIS 2-3 VIEWS RIGHT  . XR Lumbar Spine 2-3 Views   No orders of the defined types were placed in this encounter.     Procedures: No procedures performed   Clinical Data: No additional findings.   Subjective: Chief Complaint  Patient presents with  . Right Hip - Pain  . Right Shoulder - Follow-up    HPI 60 year old black female comes in for recheck of her right shoulder.  States that her right shoulder is doing much better after previous subacromial Marcaine/Depo-Medrol injection.  States that she may have some lingering stiffness but admits to not really using her shoulder had a fear that she will aggravate it.  No cervical spinal radicular component.  She does  have a new complaint today of right buttock pain, right anterior and lateral hip pain.  States that this is been ongoing for several months but never brought it up.  Occasionally has some aching in her low back but this does not limit her.  Anterior and lateral hip pain aggravated when she gets up to stand from a seated position.  Also has some lateral hip pain when she is laying on her right side at night.  Pain also in the right buttock.  Nothing radiating further down the leg.  No complaints of numbness tingling or leg weakness.  No left lower extremity symptoms.  Occasionally takes ibuprofen with some improvement.   Objective: Vital Signs: BP 120/83   Pulse 73   Physical Exam HENT:     Head: Normocephalic.  Pulmonary:     Effort: No respiratory distress.  Musculoskeletal:     Comments: Patient has moderate right sciatic notch and right greater trochanter bursa tenderness.  Negative on the left side.  Negative logroll bilateral hips.  Negative straight leg raise.  No groin pain with right hip flexed to 90 degrees and compression with internal/external rotation.  Neurologically intact.  Neurological:     General: No focal deficit present.     Mental Status: She is alert and oriented  to person, place, and time.     Ortho Exam  Specialty Comments:  No specialty comments available.  Imaging: No results found.   PMFS History: Patient Active Problem List   Diagnosis Date Noted  . Neck pain 10/02/2018   History reviewed. No pertinent past medical history.  History reviewed. No pertinent family history.  History reviewed. No pertinent surgical history. Social History   Occupational History  . Not on file  Tobacco Use  . Smoking status: Former Smoker    Types: Cigarettes  . Smokeless tobacco: Never Used  Substance and Sexual Activity  . Alcohol use: Not on file  . Drug use: Not on file  . Sexual activity: Not on file

## 2020-11-17 ENCOUNTER — Ambulatory Visit (INDEPENDENT_AMBULATORY_CARE_PROVIDER_SITE_OTHER): Payer: No Typology Code available for payment source | Admitting: Surgery

## 2020-11-17 ENCOUNTER — Encounter: Payer: Self-pay | Admitting: Surgery

## 2020-11-17 DIAGNOSIS — M7061 Trochanteric bursitis, right hip: Secondary | ICD-10-CM

## 2020-11-17 DIAGNOSIS — M5416 Radiculopathy, lumbar region: Secondary | ICD-10-CM

## 2020-11-17 NOTE — Progress Notes (Signed)
60 year old black female returns for recheck right hip and buttock pain.  States that she has had some improvement after completing the Medrol Dosepak taper.  Continues have some soreness over the right hip greater trochanter bursa but this is better.  She has started back working out at [employer]  She admits to not really doing a lot of the stretching exercises that I gave last office visit for her quads, hamstring, piriformis, IT band.  Exam Pleasant female alert and oriented in no acute distress.  Gait is normal.  Is mild tenderness over the right hip greater trochanter bursa.  Tenderness extends midway down the IT band.  Negative on the left side.  Negative logroll bilateral hips.  Negative straight leg raise.  Plan By history and exam patient's pain is improved.  I encouraged her to do the stretching exercises that were given last office visit.  I discussed scheduling a formal PT but she would like to do these exercises on her own first.  Follow-up in 5 weeks for recheck.  If lateral hip pain worsens before that time she can contact me for scheduling of greater trochanter bursa injection.  Before her follow-up and she will also call me let me know how she is doing.  If symptoms are resolved I will cancel next appointment scheduled.  All questions answered and patient is comfortable with today's treatment plan.

## 2020-12-22 ENCOUNTER — Ambulatory Visit: Payer: No Typology Code available for payment source | Admitting: Surgery

## 2022-05-22 IMAGING — CT CT HEAD W/O CM
3 of 4 series · 15 of 47 positions shown, 18 images · non-contrast
Comparison: Head CT 10/13/2006.

CLINICAL DATA: 59-year-old female with progressive chronic headache
symptoms recently. No known injury. Palpable abnormality above the
left ear.

EXAM:
CT HEAD WITHOUT CONTRAST
TECHNIQUE: Contiguous axial images were obtained from the base of the skull
through the vertex without intravenous contrast.

[Series 2: head 5.00 hr40 s3 axial ibhc · axial · 0.43mm/px · z∈[-598,-473]mm · 9 of 31 slices shown, 12 images]
[im 3/31  brain]
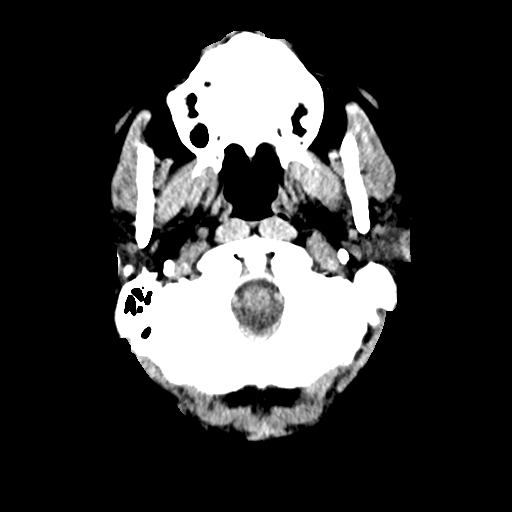
[im 3/31  bone]
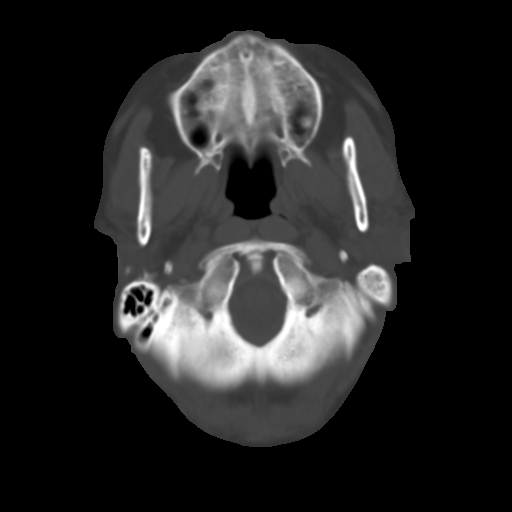
[im 7/31  brain]
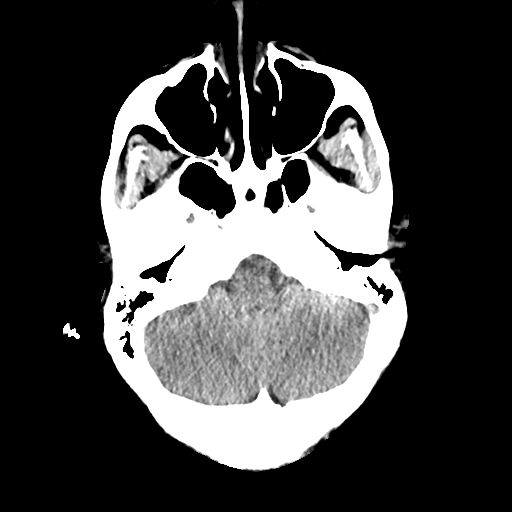
[im 9/31  brain]
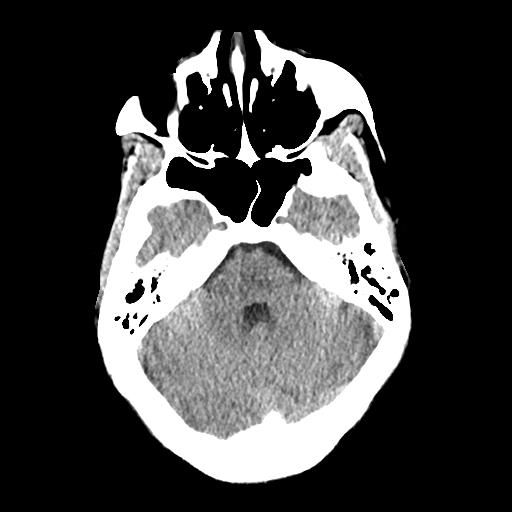
[im 13/31  brain]
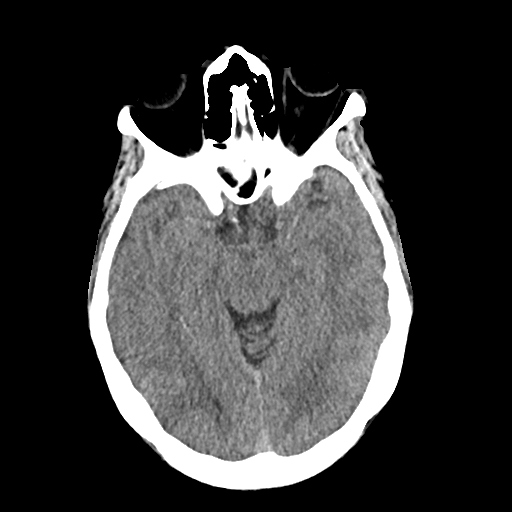
[im 16/31  brain]
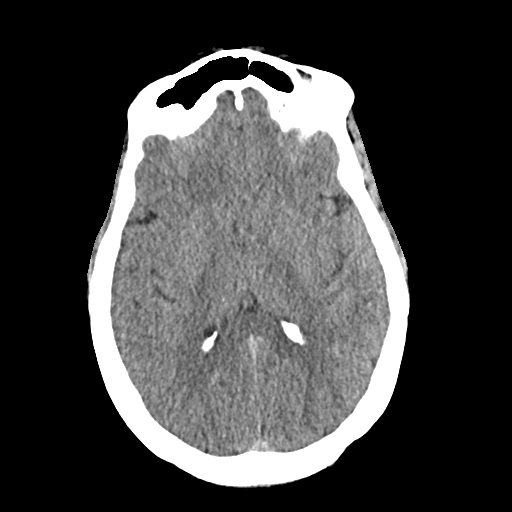
[im 16/31  bone]
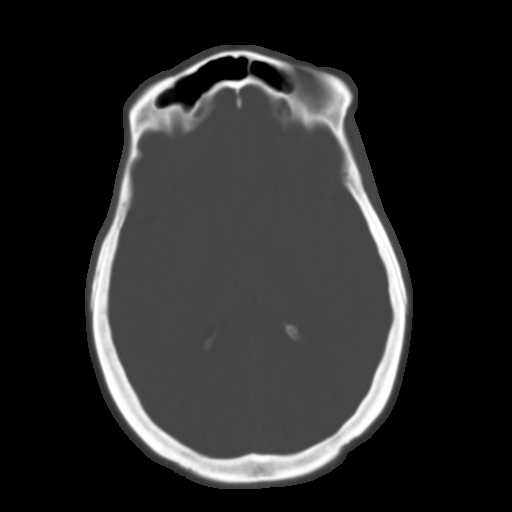
[im 18/31  brain]
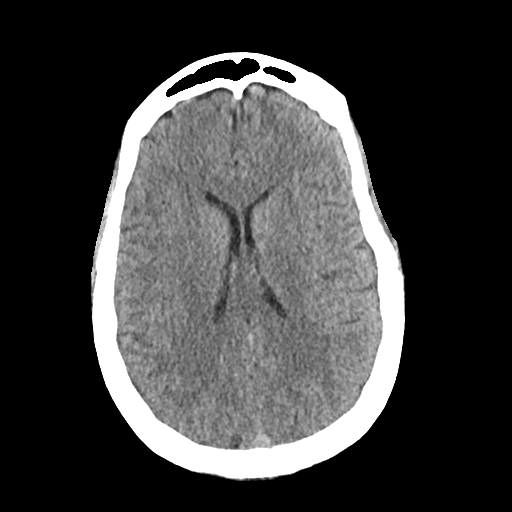
[im 22/31  brain]
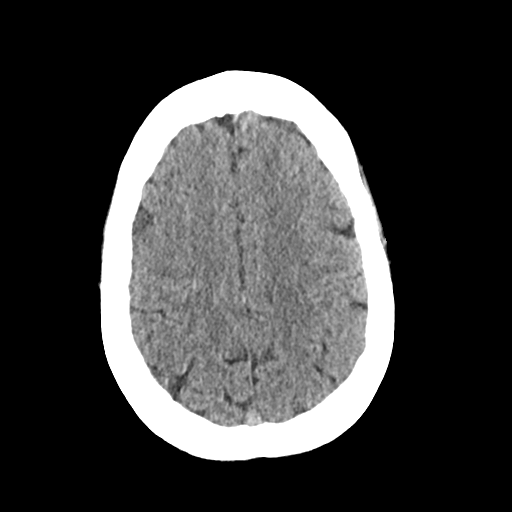
[im 24/31  brain]
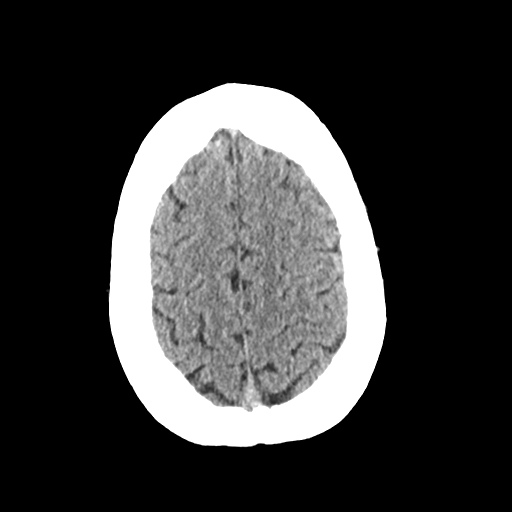
[im 28/31  brain]
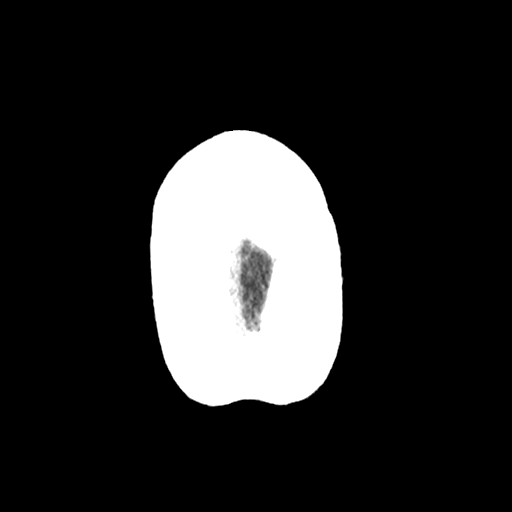
[im 28/31  bone]
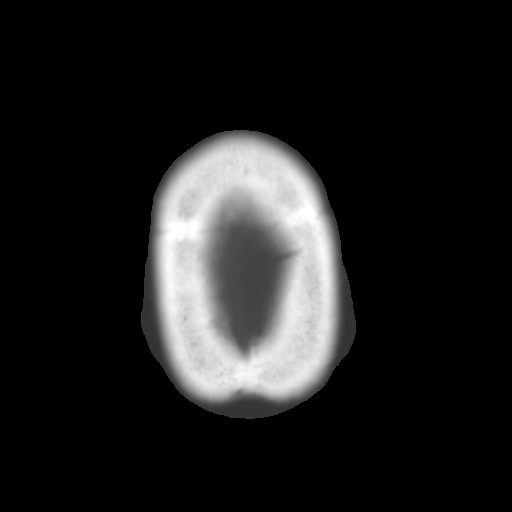

[Series 4: head 3.00 hr40 s3 sag · sagittal · 0.31mm/px · 3 of 55 slices shown]
[im 19/55  brain]
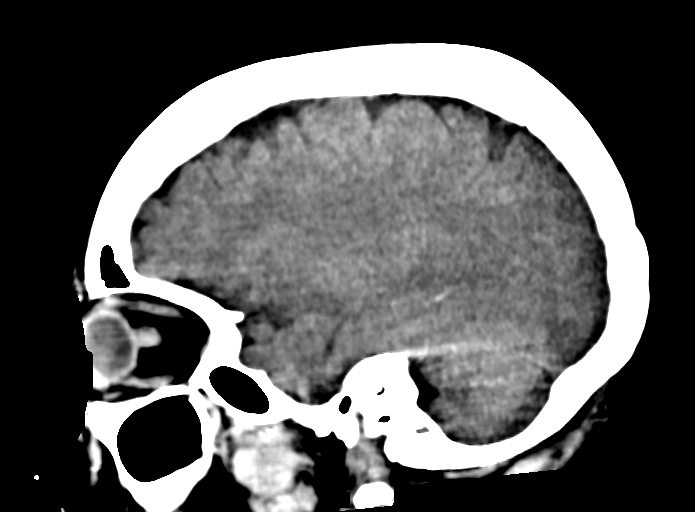
[im 28/55  brain]
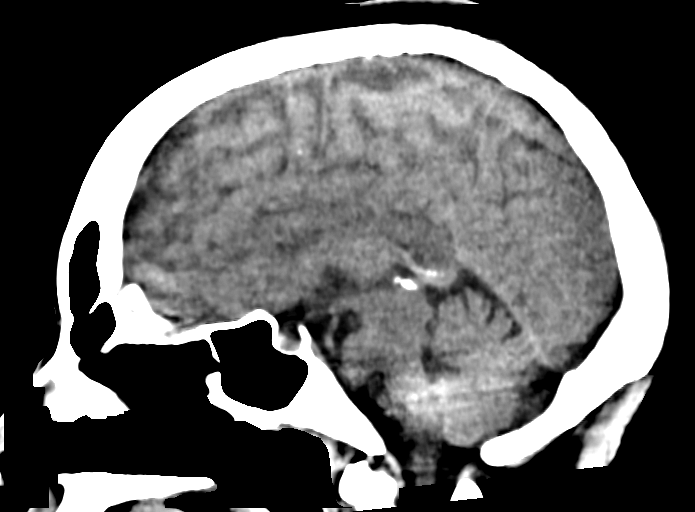
[im 37/55  brain]
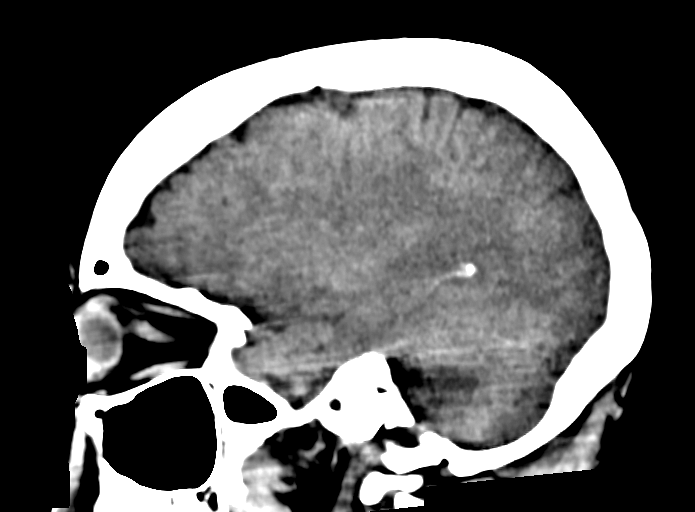

[Series 6: head 3.00 hr40 s3 cor · coronal · 0.31mm/px · 3 of 72 slices shown]
[im 24/72  brain]
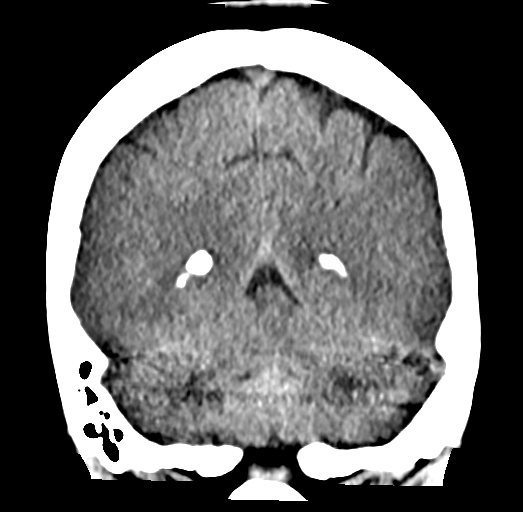
[im 32/72  brain]
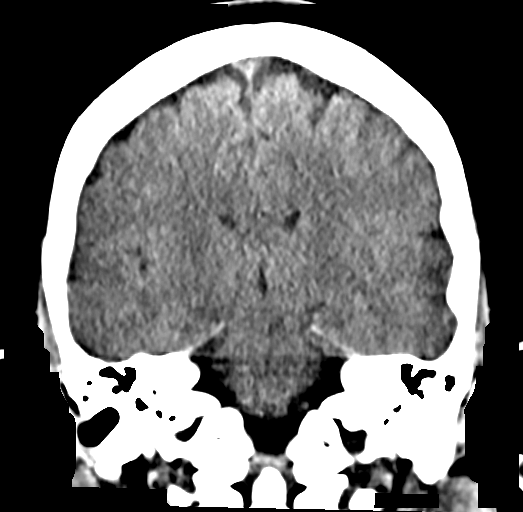
[im 40/72  brain]
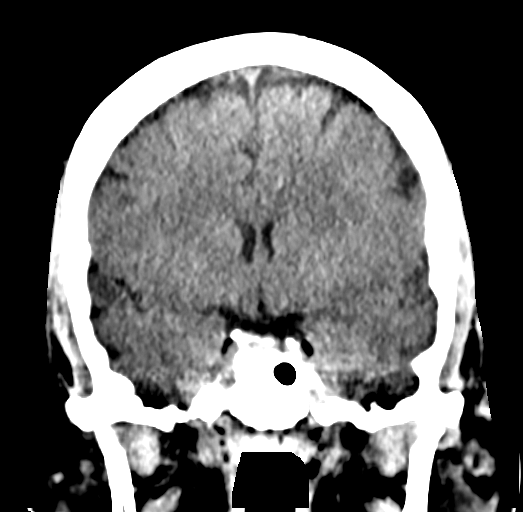

[15 of 47 positions shown; findings below may reference images not displayed]

FINDINGS: Brain: Cerebral volume remains normal. No midline shift,
ventriculomegaly, mass effect, evidence of mass lesion, intracranial
hemorrhage or evidence of cortically based acute infarction. Minimal
to mild cerebral white matter hypodensity has developed since 2112,
most apparent in the right operculum subcortical white matter.
Otherwise normal gray-white matter differentiation.

Vascular: Mild Calcified atherosclerosis at the skull base. No
suspicious intracranial vascular hyperdensity.

Skull: Stable, negative.

Sinuses/Orbits: Visualized paranasal sinuses and mastoids are clear.

Other: Visualized orbits and scalp soft tissues are within normal
limits. No lesion identified to explain palpable abnormality above
the left ear.
IMPRESSION: 1. No acute intracranial abnormality. No explanation for symptoms.
2. Mild for age nonspecific cerebral white matter changes, most
commonly due to chronic small vessel disease.
